# Patient Record
Sex: Male | Born: 2000 | Race: White | Hispanic: No | Marital: Single | Smoking: Never smoker
Health system: Southern US, Community
[De-identification: ages and names within clinical notes are randomized; demographics above are authoritative.]

---
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
## 2020-01-03 ENCOUNTER — Emergency Department (HOSPITAL_BASED_OUTPATIENT_CLINIC_OR_DEPARTMENT_OTHER)
Admission: EM | Admit: 2020-01-03 | Discharge: 2020-01-04 | Disposition: A | Discharge status: Home / Self Care | Payer: 59 | Attending: Emergency Medicine | Admitting: Emergency Medicine

## 2020-01-03 ENCOUNTER — Encounter (HOSPITAL_BASED_OUTPATIENT_CLINIC_OR_DEPARTMENT_OTHER): Payer: Self-pay | Admitting: *Deleted

## 2020-01-03 ENCOUNTER — Other Ambulatory Visit: Payer: Self-pay

## 2020-01-03 DIAGNOSIS — Y929 Unspecified place or not applicable: Secondary | ICD-10-CM | POA: Insufficient documentation

## 2020-01-03 DIAGNOSIS — Y999 Unspecified external cause status: Secondary | ICD-10-CM | POA: Insufficient documentation

## 2020-01-03 DIAGNOSIS — Y939 Activity, unspecified: Secondary | ICD-10-CM | POA: Insufficient documentation

## 2020-01-03 DIAGNOSIS — S0502XA Injury of conjunctiva and corneal abrasion without foreign body, left eye, initial encounter: Secondary | ICD-10-CM | POA: Diagnosis present

## 2020-01-03 DIAGNOSIS — X58XXXA Exposure to other specified factors, initial encounter: Secondary | ICD-10-CM | POA: Insufficient documentation

## 2020-01-03 NOTE — ED Triage Notes (Signed)
Left eye irritation since 5 pm today after getting debris blown into it. Eye watering. Denies change in vision.

## 2020-01-04 MED ORDER — TETRACAINE HCL 0.5 % OP SOLN
1.0000 [drp] | Freq: Once | OPHTHALMIC | Status: DC
  Filled 2020-01-04: qty 4

## 2020-01-04 MED ORDER — ERYTHROMYCIN 5 MG/GM OP OINT
TOPICAL_OINTMENT | OPHTHALMIC | Status: AC
  Filled 2020-01-04: qty 3.5

## 2020-01-04 MED ORDER — FLUORESCEIN SODIUM 1 MG OP STRP
1.0000 | ORAL_STRIP | Freq: Once | OPHTHALMIC | Status: DC
  Filled 2020-01-04: qty 1

## 2020-01-04 NOTE — ED Provider Notes (Signed)
[facility] EMERGENCY DEPARTMENT Provider Note   CSN: [id number] Arrival date & time: 01/03/20  2153     History Chief Complaint  Patient presents with  . Eye Pain    Richard Holt is a 19 y.o. male.  At work and got some piece of dirt in eye and has had pain in it since then.    Eye Pain This is a new problem. The current episode started 1 to 2 hours ago. The problem occurs constantly. The problem has not changed since onset.Pertinent negatives include no chest pain, no headaches and no shortness of breath. Nothing aggravates the symptoms. Nothing relieves the symptoms. He has tried nothing for the symptoms.       History reviewed. No pertinent past medical history.  There are no problems to display for this patient.   History reviewed. No pertinent surgical history.     History reviewed. No pertinent family history.  Social History   Tobacco Use  . Smoking status: Never Smoker  . Smokeless tobacco: Never Used  Substance Use Topics  . Alcohol use: Yes  . Drug use: Never    Home Medications Prior to Admission medications   Not on File    Allergies    Patient has no known allergies.  Review of Systems   Review of Systems  Eyes: Positive for pain.  Respiratory: Negative for shortness of breath.   Cardiovascular: Negative for chest pain.  Neurological: Negative for headaches.  All other systems reviewed and are negative.   Physical Exam Updated Vital Signs BP 122/81 (BP Location: Right Arm)   Pulse 65   Temp 97.8 F (36.6 C) (Oral)   Resp 16   Ht 6' 3.5" (1.918 m)   Wt 86.2 kg   SpO2 99%   BMI 23.43 kg/m   Physical Exam Vitals and nursing note reviewed.  Constitutional:      Appearance: He is well-developed.  HENT:     Head: Normocephalic and atraumatic.     Mouth/Throat:     Mouth: Mucous membranes are moist.     Pharynx: Oropharynx is clear.  Eyes:     Pupils: Pupils are equal, round, and reactive to light.     Comments:  Left eye injected conjunctiva Woods lamp with abrasion and no foreign bodies Had improved symptoms with tetracaine  Cardiovascular:     Rate and Rhythm: Normal rate.  Pulmonary:     Effort: Pulmonary effort is normal. No respiratory distress.  Abdominal:     General: There is no distension.  Musculoskeletal:        General: Normal range of motion.     Cervical back: Normal range of motion.  Skin:    General: Skin is warm and dry.  Neurological:     General: No focal deficit present.     Mental Status: He is alert.     ED Results / Procedures / Treatments   Labs (all labs ordered are listed, but only abnormal results are displayed) Labs Reviewed - No data to display  EKG None  Radiology No results found.  Procedures Procedures (including critical care time)  Medications Ordered in ED Medications  tetracaine (PONTOCAINE) 0.5 % ophthalmic solution 1 drop (has no administration in time range)  fluorescein ophthalmic strip 1 strip (has no administration in time range)  erythromycin 5 MG/GM ophthalmic ointment (has no administration in time range)    ED Course  I have reviewed the triage vital signs and the nursing notes.  Pertinent labs & imaging results that were available during my care of the patient were reviewed by me and considered in my medical decision making (see chart for details).    MDM Rules/Calculators/A&P                          Corneal abrasion, erythromycin ointment started. ophtho follow up if no timproving.  Final Clinical Impression(s) / ED Diagnoses Final diagnoses:  Abrasion of left cornea, initial encounter    Rx / DC Orders ED Discharge Orders    None       Auden Wettstein, Barbara Cower, MD 01/04/20 [phone]

## 2020-01-04 NOTE — Discharge Instructions (Addendum)
Use erythromycin ointment three times daily for next 5 days
